# Patient Record
Sex: Female | Born: 2005 | Race: Asian | Hispanic: No | Marital: Single | Smoking: Never smoker
Health system: Southern US, Community
[De-identification: ages and names within clinical notes are randomized; demographics above are authoritative.]

## PROBLEM LIST (undated history)

## (undated) DIAGNOSIS — Z950 Presence of cardiac pacemaker: Secondary | ICD-10-CM

## (undated) DIAGNOSIS — I4581 Long QT syndrome: Secondary | ICD-10-CM

## (undated) DIAGNOSIS — R42 Dizziness and giddiness: Secondary | ICD-10-CM

---
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
## 2024-01-02 DIAGNOSIS — N62 Hypertrophy of breast: Secondary | ICD-10-CM | POA: Diagnosis not present

## 2024-01-14 DIAGNOSIS — Z95810 Presence of automatic (implantable) cardiac defibrillator: Secondary | ICD-10-CM | POA: Diagnosis not present

## 2024-01-23 DIAGNOSIS — N62 Hypertrophy of breast: Secondary | ICD-10-CM | POA: Diagnosis not present

## 2024-02-01 DIAGNOSIS — I495 Sick sinus syndrome: Secondary | ICD-10-CM | POA: Diagnosis not present

## 2024-02-01 DIAGNOSIS — Z95810 Presence of automatic (implantable) cardiac defibrillator: Secondary | ICD-10-CM | POA: Diagnosis not present

## 2024-02-01 DIAGNOSIS — R5383 Other fatigue: Secondary | ICD-10-CM | POA: Diagnosis not present

## 2024-02-01 DIAGNOSIS — I4581 Long QT syndrome: Secondary | ICD-10-CM | POA: Diagnosis not present

## 2024-02-20 DIAGNOSIS — R059 Cough, unspecified: Secondary | ICD-10-CM | POA: Diagnosis not present

## 2024-02-20 DIAGNOSIS — R0789 Other chest pain: Secondary | ICD-10-CM | POA: Diagnosis not present

## 2024-03-10 DIAGNOSIS — I4581 Long QT syndrome: Secondary | ICD-10-CM | POA: Diagnosis not present

## 2024-03-10 DIAGNOSIS — G729 Myopathy, unspecified: Secondary | ICD-10-CM | POA: Diagnosis not present

## 2024-04-14 DIAGNOSIS — I495 Sick sinus syndrome: Secondary | ICD-10-CM | POA: Diagnosis not present

## 2024-04-14 DIAGNOSIS — R55 Syncope and collapse: Secondary | ICD-10-CM | POA: Diagnosis not present

## 2024-04-14 DIAGNOSIS — Z95810 Presence of automatic (implantable) cardiac defibrillator: Secondary | ICD-10-CM | POA: Diagnosis not present

## 2024-04-14 DIAGNOSIS — Z8674 Personal history of sudden cardiac arrest: Secondary | ICD-10-CM | POA: Diagnosis not present

## 2024-04-22 DIAGNOSIS — M6281 Muscle weakness (generalized): Secondary | ICD-10-CM | POA: Diagnosis not present

## 2024-04-22 DIAGNOSIS — S060X1S Concussion with loss of consciousness of 30 minutes or less, sequela: Secondary | ICD-10-CM | POA: Diagnosis not present

## 2024-04-22 DIAGNOSIS — G729 Myopathy, unspecified: Secondary | ICD-10-CM | POA: Diagnosis not present

## 2024-04-22 DIAGNOSIS — X58XXXS Exposure to other specified factors, sequela: Secondary | ICD-10-CM | POA: Diagnosis not present

## 2024-04-29 DIAGNOSIS — X58XXXS Exposure to other specified factors, sequela: Secondary | ICD-10-CM | POA: Diagnosis not present

## 2024-04-29 DIAGNOSIS — M6281 Muscle weakness (generalized): Secondary | ICD-10-CM | POA: Diagnosis not present

## 2024-04-29 DIAGNOSIS — S060X1S Concussion with loss of consciousness of 30 minutes or less, sequela: Secondary | ICD-10-CM | POA: Diagnosis not present

## 2024-04-29 DIAGNOSIS — G729 Myopathy, unspecified: Secondary | ICD-10-CM | POA: Diagnosis not present

## 2024-05-10 DIAGNOSIS — S060X1S Concussion with loss of consciousness of 30 minutes or less, sequela: Secondary | ICD-10-CM | POA: Diagnosis not present

## 2024-05-10 DIAGNOSIS — R52 Pain, unspecified: Secondary | ICD-10-CM | POA: Diagnosis not present

## 2024-05-10 DIAGNOSIS — M6281 Muscle weakness (generalized): Secondary | ICD-10-CM | POA: Diagnosis not present

## 2024-05-10 DIAGNOSIS — X58XXXS Exposure to other specified factors, sequela: Secondary | ICD-10-CM | POA: Diagnosis not present

## 2024-05-10 DIAGNOSIS — G729 Myopathy, unspecified: Secondary | ICD-10-CM | POA: Diagnosis not present

## 2024-05-16 DIAGNOSIS — M6281 Muscle weakness (generalized): Secondary | ICD-10-CM | POA: Diagnosis not present

## 2024-05-16 DIAGNOSIS — R52 Pain, unspecified: Secondary | ICD-10-CM | POA: Diagnosis not present

## 2024-05-16 DIAGNOSIS — X58XXXS Exposure to other specified factors, sequela: Secondary | ICD-10-CM | POA: Diagnosis not present

## 2024-05-16 DIAGNOSIS — S060X1S Concussion with loss of consciousness of 30 minutes or less, sequela: Secondary | ICD-10-CM | POA: Diagnosis not present

## 2024-05-16 DIAGNOSIS — G729 Myopathy, unspecified: Secondary | ICD-10-CM | POA: Diagnosis not present

## 2024-06-13 DIAGNOSIS — M25512 Pain in left shoulder: Secondary | ICD-10-CM | POA: Diagnosis not present

## 2024-06-13 DIAGNOSIS — X58XXXS Exposure to other specified factors, sequela: Secondary | ICD-10-CM | POA: Diagnosis not present

## 2024-06-13 DIAGNOSIS — M6281 Muscle weakness (generalized): Secondary | ICD-10-CM | POA: Diagnosis not present

## 2024-06-13 DIAGNOSIS — R52 Pain, unspecified: Secondary | ICD-10-CM | POA: Diagnosis not present

## 2024-06-13 DIAGNOSIS — S060X1S Concussion with loss of consciousness of 30 minutes or less, sequela: Secondary | ICD-10-CM | POA: Diagnosis not present

## 2024-06-13 DIAGNOSIS — G729 Myopathy, unspecified: Secondary | ICD-10-CM | POA: Diagnosis not present

## 2024-06-20 DIAGNOSIS — N62 Hypertrophy of breast: Secondary | ICD-10-CM | POA: Diagnosis not present

## 2024-07-02 ENCOUNTER — Other Ambulatory Visit: Payer: Self-pay | Admitting: Obstetrics and Gynecology

## 2024-07-02 DIAGNOSIS — Z01419 Encounter for gynecological examination (general) (routine) without abnormal findings: Secondary | ICD-10-CM | POA: Diagnosis not present

## 2024-07-02 DIAGNOSIS — N63 Unspecified lump in unspecified breast: Secondary | ICD-10-CM

## 2024-07-14 ENCOUNTER — Ambulatory Visit
Admission: RE | Admit: 2024-07-14 | Discharge: 2024-07-14 | Disposition: A | Discharge status: Home / Self Care | Source: Ambulatory Visit | Attending: Obstetrics and Gynecology | Admitting: Obstetrics and Gynecology

## 2024-07-14 DIAGNOSIS — N6489 Other specified disorders of breast: Secondary | ICD-10-CM | POA: Diagnosis not present

## 2024-07-14 DIAGNOSIS — N63 Unspecified lump in unspecified breast: Secondary | ICD-10-CM

## 2024-07-14 DIAGNOSIS — I4581 Long QT syndrome: Secondary | ICD-10-CM | POA: Diagnosis not present

## 2024-07-14 DIAGNOSIS — R55 Syncope and collapse: Secondary | ICD-10-CM | POA: Diagnosis not present

## 2024-07-14 DIAGNOSIS — Z8674 Personal history of sudden cardiac arrest: Secondary | ICD-10-CM | POA: Diagnosis not present

## 2024-07-14 DIAGNOSIS — I495 Sick sinus syndrome: Secondary | ICD-10-CM | POA: Diagnosis not present

## 2024-10-13 DIAGNOSIS — I4581 Long QT syndrome: Secondary | ICD-10-CM | POA: Diagnosis not present

## 2024-10-13 DIAGNOSIS — Z8674 Personal history of sudden cardiac arrest: Secondary | ICD-10-CM | POA: Diagnosis not present

## 2024-10-13 DIAGNOSIS — R55 Syncope and collapse: Secondary | ICD-10-CM | POA: Diagnosis not present

## 2024-10-13 DIAGNOSIS — I495 Sick sinus syndrome: Secondary | ICD-10-CM | POA: Diagnosis not present

## 2024-10-23 DIAGNOSIS — I495 Sick sinus syndrome: Secondary | ICD-10-CM | POA: Diagnosis not present

## 2024-10-23 DIAGNOSIS — Z87820 Personal history of traumatic brain injury: Secondary | ICD-10-CM | POA: Diagnosis not present

## 2024-10-23 DIAGNOSIS — R9431 Abnormal electrocardiogram [ECG] [EKG]: Secondary | ICD-10-CM | POA: Diagnosis not present

## 2024-10-23 DIAGNOSIS — G729 Myopathy, unspecified: Secondary | ICD-10-CM | POA: Diagnosis not present

## 2024-10-23 DIAGNOSIS — I4581 Long QT syndrome: Secondary | ICD-10-CM | POA: Diagnosis not present

## 2024-10-23 DIAGNOSIS — Z95810 Presence of automatic (implantable) cardiac defibrillator: Secondary | ICD-10-CM | POA: Diagnosis not present

## 2024-10-23 DIAGNOSIS — Z8674 Personal history of sudden cardiac arrest: Secondary | ICD-10-CM | POA: Diagnosis not present

## 2024-10-23 DIAGNOSIS — Q999 Chromosomal abnormality, unspecified: Secondary | ICD-10-CM | POA: Diagnosis not present

## 2024-10-24 DIAGNOSIS — R519 Headache, unspecified: Secondary | ICD-10-CM | POA: Diagnosis not present

## 2024-11-07 ENCOUNTER — Other Ambulatory Visit: Payer: Self-pay | Admitting: Family Medicine

## 2024-11-07 DIAGNOSIS — R519 Headache, unspecified: Secondary | ICD-10-CM

## 2024-11-13 DIAGNOSIS — R519 Headache, unspecified: Secondary | ICD-10-CM | POA: Diagnosis not present
